# Patient Record
Sex: Male | Born: 1960 | Race: White | Hispanic: No | Marital: Married | Smoking: Never smoker
Health system: Southern US, Community
[De-identification: ages and names within clinical notes are randomized; demographics above are authoritative.]

## PROBLEM LIST (undated history)

## (undated) HISTORY — PX: BACK SURGERY: SHX140

## (undated) HISTORY — PX: VASECTOMY: SHX75

---

## 1998-02-07 ENCOUNTER — Ambulatory Visit (HOSPITAL_COMMUNITY): Admission: RE | Admit: 1998-02-07 | Discharge: 1998-02-07 | Payer: Self-pay | Admitting: Family Medicine

## 1998-03-15 ENCOUNTER — Encounter: Admission: RE | Admit: 1998-03-15 | Discharge: 1998-06-13 | Payer: Self-pay | Admitting: Family Medicine

## 2000-02-06 ENCOUNTER — Ambulatory Visit: Admission: RE | Admit: 2000-02-06 | Discharge: 2000-02-06 | Payer: Self-pay | Admitting: Internal Medicine

## 2000-02-06 ENCOUNTER — Encounter: Payer: Self-pay | Admitting: Internal Medicine

## 2005-06-27 ENCOUNTER — Emergency Department (HOSPITAL_COMMUNITY): Admission: EM | Admit: 2005-06-27 | Discharge: 2005-06-27 | Payer: Self-pay | Admitting: Emergency Medicine

## 2005-06-28 ENCOUNTER — Encounter: Admission: RE | Admit: 2005-06-28 | Discharge: 2005-06-28 | Payer: Self-pay | Admitting: Internal Medicine

## 2005-06-28 ENCOUNTER — Ambulatory Visit: Payer: Self-pay | Admitting: Internal Medicine

## 2007-04-09 ENCOUNTER — Telehealth: Payer: Self-pay | Admitting: Internal Medicine

## 2007-04-17 ENCOUNTER — Encounter: Payer: Self-pay | Admitting: *Deleted

## 2007-04-17 DIAGNOSIS — I219 Acute myocardial infarction, unspecified: Secondary | ICD-10-CM | POA: Insufficient documentation

## 2007-04-17 DIAGNOSIS — I201 Angina pectoris with documented spasm: Secondary | ICD-10-CM

## 2007-04-17 DIAGNOSIS — R55 Syncope and collapse: Secondary | ICD-10-CM

## 2007-04-17 DIAGNOSIS — Z8669 Personal history of other diseases of the nervous system and sense organs: Secondary | ICD-10-CM

## 2007-04-17 DIAGNOSIS — Z87442 Personal history of urinary calculi: Secondary | ICD-10-CM

## 2007-05-21 ENCOUNTER — Telehealth: Payer: Self-pay | Admitting: Internal Medicine

## 2007-07-02 ENCOUNTER — Telehealth: Payer: Self-pay | Admitting: Internal Medicine

## 2007-11-20 ENCOUNTER — Telehealth: Payer: Self-pay | Admitting: Internal Medicine

## 2007-12-01 ENCOUNTER — Encounter: Payer: Self-pay | Admitting: Internal Medicine

## 2008-04-02 ENCOUNTER — Ambulatory Visit: Payer: Self-pay | Admitting: Internal Medicine

## 2008-04-02 DIAGNOSIS — I83893 Varicose veins of bilateral lower extremities with other complications: Secondary | ICD-10-CM | POA: Insufficient documentation

## 2008-04-02 DIAGNOSIS — M25559 Pain in unspecified hip: Secondary | ICD-10-CM

## 2008-04-03 DIAGNOSIS — R234 Changes in skin texture: Secondary | ICD-10-CM | POA: Insufficient documentation

## 2008-05-07 ENCOUNTER — Ambulatory Visit: Payer: Self-pay | Admitting: Endocrinology

## 2008-06-25 ENCOUNTER — Telehealth: Payer: Self-pay | Admitting: Internal Medicine

## 2008-07-02 ENCOUNTER — Encounter: Payer: Self-pay | Admitting: Internal Medicine

## 2008-09-09 ENCOUNTER — Encounter: Payer: Self-pay | Admitting: Internal Medicine

## 2008-12-16 ENCOUNTER — Ambulatory Visit: Payer: Self-pay | Admitting: Internal Medicine

## 2009-01-10 ENCOUNTER — Telehealth: Payer: Self-pay | Admitting: Internal Medicine

## 2009-01-27 ENCOUNTER — Encounter: Payer: Self-pay | Admitting: Internal Medicine

## 2009-03-25 ENCOUNTER — Ambulatory Visit: Payer: Self-pay | Admitting: Internal Medicine

## 2009-08-26 ENCOUNTER — Telehealth: Payer: Self-pay | Admitting: Internal Medicine

## 2009-09-06 ENCOUNTER — Ambulatory Visit: Payer: Self-pay | Admitting: Internal Medicine

## 2009-09-07 DIAGNOSIS — M79609 Pain in unspecified limb: Secondary | ICD-10-CM | POA: Insufficient documentation

## 2009-09-09 ENCOUNTER — Telehealth: Payer: Self-pay | Admitting: Internal Medicine

## 2009-09-12 ENCOUNTER — Ambulatory Visit: Payer: Self-pay | Admitting: Family Medicine

## 2009-09-12 DIAGNOSIS — M7520 Bicipital tendinitis, unspecified shoulder: Secondary | ICD-10-CM | POA: Insufficient documentation

## 2009-10-19 ENCOUNTER — Telehealth: Payer: Self-pay | Admitting: Gastroenterology

## 2010-03-15 ENCOUNTER — Encounter: Admission: RE | Admit: 2010-03-15 | Discharge: 2010-03-15 | Payer: Self-pay | Admitting: Internal Medicine

## 2010-03-15 ENCOUNTER — Ambulatory Visit: Payer: Self-pay | Admitting: Internal Medicine

## 2010-03-15 ENCOUNTER — Telehealth: Payer: Self-pay | Admitting: Internal Medicine

## 2010-03-17 ENCOUNTER — Encounter: Payer: Self-pay | Admitting: Internal Medicine

## 2010-03-18 ENCOUNTER — Ambulatory Visit (HOSPITAL_COMMUNITY): Admission: RE | Admit: 2010-03-18 | Discharge: 2010-03-19 | Payer: Self-pay | Admitting: Neurosurgery

## 2010-04-10 ENCOUNTER — Encounter: Payer: Self-pay | Admitting: Internal Medicine

## 2010-06-13 ENCOUNTER — Encounter: Payer: Self-pay | Admitting: Internal Medicine

## 2010-07-18 NOTE — Progress Notes (Signed)
Summary: Change GI care  Phone Note Outgoing Call Call back at [facility] Phone [phone]   Call placed by: Harlow Mares CMA Duncan Dull),  Oct 19, 2009 11:29 AM Call placed to: Patient Summary of Call: due for colonoscopy...Marland KitchenMarland KitchenMarland Kitchen patient is going to [facility] now.  Initial call taken by: Harlow Mares CMA (AAMA),  Oct 19, 2009 11:30 AM

## 2010-07-18 NOTE — Assessment & Plan Note (Signed)
Summary: RIGHT ARM PAIN/NOT BETTER/ WANTED TO WAIT FOR MEN/NWS   Vital Signs:  Patient profile:   50 year old male Height:      69.5 inches (176.53 cm) Weight:      188 pounds (85.45 kg) O2 Sat:      95 % on Room air Temp:     98.5 degrees F (36.94 degrees C) oral Pulse rate:   81 / minute BP sitting:   142 / 60  (left arm) Cuff size:   regular  Vitals Entered By: Bill Salinas CMA (September 06, 2009 2:42 PM) Taken by [employer]  O2 Flow:  Room air CC: pt c/o constant pain in his R arm (radiating from mid-cubital to shoulder) X 5 months. per pt, last tetanus was 05/2009.//[place]   Primary Care Provider:  Jacques Navy MD  CC:  pt c/o constant pain in his R arm (radiating from mid-cubital to shoulder) X 5 months. per pt and last tetanus was 05/2009.//[place].  History of Present Illness: Mr. Cuny was seen by Dr. Yetta Barre Oct 8th for right arm pain diagnosed as bicipital tendonistis. An arm sleeve, iceing and rest were recommended. In the intervening weeks the discomfort has become worse especially with any forceful use of the arm or with full extension. He also reports noticeable difference in muscle mass at the distal aspect of the proximal musculature. Pain does radiate cephlad to the full biceps. He is minimally limited in his physical acitivity.   Current Medications (verified): 1)  Levitra 20 Mg Tabs (Vardenafil Hcl) .Marland Kitchen.. 1 Once Daily As Needed 2)  Multivitamins   Tabs (Multiple Vitamin) .... Take One Tablet Once Dailyd 3)  Epipen 0.3 Mg/0.26ml (1:1000)  Devi (Epinephrine Hcl (Anaphylaxis)) .... Use As Directed Pt Needs F/u in Office 4)  Ibuprofen 200 Mg Tabs (Ibuprofen) .... As Needed  Allergies (verified): 1)  ! Codeine 2)  ! Erythromycin PMH-FH-SH reviewed-no changes except otherwise noted  Review of Systems       The patient complains of muscle weakness.  The patient denies anorexia, weight loss, dyspnea on exertion, and enlarged lymph nodes.    Physical  Exam  General:  Well-developed,well-nourished,in no acute distress; alert,appropriate and cooperative throughout examination Msk:  no deformity of the right UE. With flexion and muscle flexing there appears to be a tissue deficit at the medial aspect of the biceps area at the origin. Good grip strength, normal sensation, normal corrdination.   Impression & Recommendations:  Problem # 1:  PAIN IN SOFT TISSUES OF LIMB (ICD-729.5) patients presentation and exam is suggestive of partial avulsion of the brachialis at the origin.  Plan - refer to Dr. Kerin Perna for sports medicine consult re: diagnosis and treatment. Appointment schedule for Monday, March 28th @ 11:00 AM - patient is aware.   Complete Medication List: 1)  Levitra 20 Mg Tabs (Vardenafil hcl) .Marland Kitchen.. 1 once daily as needed 2)  Multivitamins Tabs (Multiple vitamin) .... Take one tablet once dailyd 3)  Epipen 0.3 Mg/0.75ml (1:1000) Devi (Epinephrine hcl (anaphylaxis)) .... Use as directed pt needs f/u in office 4)  Ibuprofen 200 Mg Tabs (Ibuprofen) .... As needed  Other Orders: Sports Medicine (Sports Med)  Preventive Care Screening  Last Tetanus Booster:    Date:  05/18/2009    Results:  Given

## 2010-07-18 NOTE — Letter (Signed)
Summary: [facility]  [facility]   Imported By: Lester Napoleon 05/01/2010 10:20:08  _____________________________________________________________________  External Attachment:    Type:   Image     Comment:   External Document

## 2010-07-18 NOTE — Miscellaneous (Signed)
[facility]   Imported By: Lester Paris 09/15/2009 11:38:55  _____________________________________________________________________  External Attachment:    Type:   Image     Comment:   External Document

## 2010-07-18 NOTE — Progress Notes (Signed)
Summary: Levitra  Phone Note Refill Request Message from:  Patient on August 26, 2009 12:58 PM  Refills Requested: Medication #1:  LEVITRA 20 MG TABS 1 once daily as needed Initial call taken by: Lucious Groves,  August 26, 2009 12:58 PM    Prescriptions: LEVITRA 20 MG TABS (VARDENAFIL HCL) 1 once daily as needed  #6 x 12   Entered by:   Lucious Groves   Authorized by:   Jacques Navy MD   Signed by:   Lucious Groves on 08/26/2009   Method used:   [facility]  [address] [phone][facility] (retail[facility]       [address]       [place], [place]  [id number]       Ph: [phone] or [phone]       Fax: [phone]   RxID:   [id number]

## 2010-07-18 NOTE — Progress Notes (Signed)
Summary: OV TODAY?  Phone Note Call from Patient Call back at Home Phone [phone] Call back at Wife - Diane [phone]   Summary of Call: Pt was seen by ER over the weekend for back and leg pain. Patient is requesting office visit with MD for pain management while waiting on ER referral to neuro. OK for work in apt today?  Initial call taken by: Lamar Sprinkles, CMA,  March 15, 2010 10:13 AM  Follow-up for Phone Call        Scheduled for today, ok per MD Follow-up by: Lamar Sprinkles, CMA,  March 15, 2010 10:48 AM

## 2010-07-18 NOTE — Progress Notes (Signed)
Summary: FYI-Dr Dallas Schimke  Phone Note From Other Clinic   Summary of Call: Dr Dallas Schimke office called to notify that he was to see the patient on Monday, but MD will not be there, so the patient will be r/s to thurs. Initial call taken by: Lucious Groves,  September 09, 2009 10:21 AM     Appended Document: FYI-Dr Dallas Schimke I had a family emergency, but was able to be in the office.

## 2010-07-18 NOTE — Assessment & Plan Note (Signed)
Summary: ORTHO CONSULT PER DR NORINS/CLE   Vital Signs:  Patient profile:   50 year old male Height:      69.5 inches Weight:      190.8 pounds BMI:     27.87 Temp:     98.1 degrees F oral Pulse rate:   80 / minute Pulse rhythm:   regular BP sitting:   120 / 78  (left arm) Cuff size:   regular  Vitals Entered By: Benny Lennert CMA Duncan Dull) (September 12, 2009 10:48 AM)  Primary Care Provider:  Jacques Navy MD   History of Present Illness: Chief complaint consult from dr norins  50 year old with right arm pain seen at the request of Dr. Debby Bud:  In october, was in the gym was doing an arm work-out, then woke up and his arm really was hurting. however, he does not describe any focal occult injury at that time. at that time, he saw Dr. Yetta Barre, was  told he had some bicipital tendinitis, and  altered his  activities, discontinue weightlifting, and  took some ibuprofen and a compression sleeve.  Subsequently, he saw Dr. Debby Bud, and it was noted that he had some continued,  significant pain in his antecubital fossa  and pain with flexion.  at this point, he has continued to work out his left arm without any significant problems.  His altered his lips, and he is able to do some back work  with only some pain. He is able to do full weight pull-ups, and other back work.  Took about 2 months off. No change now. But cannot do as much with this arm. Always will hurt.   No PT, really active, wants to work out with his 86 year old son hard.    Allergies: 1)  ! Codeine 2)  ! Erythromycin  Past History:  Past medical, surgical, family and social histories (including risk factors) reviewed, and no changes noted (except as noted below).  Past Medical History: Reviewed history from 04/17/2007 and no changes required. 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 father - deceased @ 45: Lung cancer - smoker, DM mother - 42:  good health, "borderline" DM Neg- prostate or colon cancer Brother - DM  Social History: Reviewed history from 04/02/2008 and no changes required. 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  Review of Systems       REVIEW OF SYSTEMS  GEN: No systemic complaints, no fevers, chills, sweats, or other acute illnesses MSK: Detailed in the HPI GI: tolerating PO intake without difficulty Neuro: No numbness, parasthesias, or tingling associated. Otherwise the pertinent positives of the ROS are noted above.    Physical Exam  General:  GEN: Well-developed,well-nourished,in no acute distress; alert,appropriate and cooperative throughout examination HEENT: Normocephalic and atraumatic without obvious abnormalities. No apparent alopecia or balding. Ears, externally no deformities PULM: Breathing comfortably in no respiratory distress EXT: No clubbing, cyanosis, or edema PSYCH: Normally interactive. Cooperative during the interview. Pleasant. Friendly and conversant. Not anxious or depressed appearing. Normal, full affect.  Additional Exam:  Shoulder: Right Inspection: No muscle wasting or winging Ecchymosis/edema:  neg  AC joint, scapula, clavicle: NT Cervical spine: NT, full ROM Spurling's: neg Abduction: full, 5/5 Flexion: full, 5/5 IR, full, lift-off: 5/5 ER at neutral: full, 5/5 AC crossover and compression: neg Neer: neg Hawkins: neg Drop Test: neg Empty Can: neg Supraspinatus insertion: NT Bicipital groove: NT Speed's: POS Yergason's: POS Sulcus sign: neg C5-T1 intact Sensation intact Grip 5/5    RIGHT elbow Ecchymosis or edema: neg ROM: full flexion, extension, pronation, supination Flexion: 4/5,  tested for strength, speech test is markedly positive in Yergason's test is markedly positive  in the distal bicep. There is no discrete muscular defect seen at time of  strength testing. There is some difference in biceps volume compared to the contralateral side  Lateral brachialis is notably tender to palpation  throughout, more so than the distal biceps tendon  Extension: 5/5 Supination: 4/5 , resisted supination causes marked pain Pronation: 5/5 Wrist ext: 5/5 Wrist flexion: 5/5 No gross bony abnormality Varus and Valgus stress: stable ECRB tenderness: neg Medial epicondyle: NT Lateral epicondyle, resisted wrist extension from wrist full pronation and flexion: NT grip: 5/5  sensation intact Tinel's, Elbow: negative    Impression & Recommendations:  Problem # 1:  PAIN IN SOFT TISSUES OF LIMB (ICD-729.5) Assessment New most properly,  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  At initial time of injury, cannot rule out partial  avulsion,  however at this time there is no muscular defect  that I can appreciate, and at this point, scar tissue would formed and treatment algorithms are the same.  of utmost importance, this  needs to be  properly rehabbed. I reviewed with him Dr. Caralee Ates elbow program,, with key emphasis on  eccentric  pronation and supination. And also asked him to do  isolation  bicep  eccentric exercise with  traditional palm up  eccentric lowers, eccentric hammer curls, and eccentric  reverse curls. think this will most likely get all aspects of the biceps and brachialis.  acentric exercises been shown in multiple  studies in tendinopathy, mostly in Achilles tendon not become a patellar tendinopathy, rotator cuff tendinopathy, and medial epicondylitis and lateral epicondylitis to aid in tendon remodelling, encourage blood flow on doppler, and reverse  avascular tendon degeneration.   in almost all cases, these  injuries do well with nonoperative conservative management.  Thank for the loss and muscle volume is most likely due to deconditioning compared to the contralateral side.  Voltaren gel 4 times daily  Cross friction massage  cc: Dr. Illene Regulus  Problem # 2:  BICEPS TENDINITIS, RIGHT (SEG-315.17) Assessment: New  Complete Medication List: 1)  Levitra 20 Mg Tabs (Vardenafil hcl) .Marland Kitchen.. 1 once daily as needed 2)  Multivitamins Tabs (Multiple vitamin) .... Take one tablet once dailyd 3)  Epipen 0.3 Mg/0.69ml (1:1000) Devi (Epinephrine hcl (anaphylaxis)) .... Use as directed pt needs f/u in office 4)  Voltaren 1 % Gel (Diclofenac sodium) .... Apply 4 times daily to affected area  Patient Instructions: 1)  f/u 6 weeks Prescriptions: VOLTAREN 1 %  GEL (DICLOFENAC SODIUM) Apply 4 times daily to affected area  #3 tubes x 11   Entered and Authorized by:   Hannah Beat MD   Signed by:   Hannah Beat MD on 09/12/2009   Method used:   [facility]  [address] [phone]* (retail)       [address]       [place]  [place], [place]  [id number]       Ph: [phone] or [phone]       Fax: [phone]   RxID:   [id number]   Current Allergies (reviewed today): ! CODEINE ! ERYTHROMYCIN

## 2010-07-18 NOTE — Consult Note (Signed)
Summary: [facility]   Imported By: Sherian Rein 03/28/2010 15:01:18  _____________________________________________________________________  External Attachment:    Type:   Image     Comment:   External Document

## 2010-07-18 NOTE — Assessment & Plan Note (Signed)
Summary: req pain meds/SD   Vital Signs:  Patient profile:   50 year old male Height:      69.5 inches Weight:      187 pounds BMI:     27.32 O2 Sat:      98 % on Room air Temp:     97.9 degrees F oral Pulse rate:   82 / minute BP sitting:   160 / 100  (left arm) Cuff size:   regular  Vitals Entered By: Bill Salinas CMA (March 15, 2010 11:33 AM)  O2 Flow:  Room air CC: ov to discuss pain management until he can be seen with a nuerologist/ ab   Primary Care Provider:  Jacques Navy MD  CC:  ov to discuss pain management until he can be seen with a nuerologist/ ab.  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 He was seen at an Urgent care, diagnosed with back pain/strain and was put on flexeril. He took this for a week. He was careful about lifting and using his back and the pain was reasonable. Saturday he had th on-set of severe pain in the left low back with radiation all the way to his foot. He was seen at [facility] ED where he was told that plain x-rays were unrevealing. He was given Prednisone 50,40,30,30, 10 to take as well as oxycodone which did allow him to sleep. No loss of control of bowel or bladder.  He present today with excruciating pain unrelieved by prednisone. He has a hard time walking and the legs feels like it is tingling and uncomfortable.   Current Medications (verified): 1)  Levitra 20 Mg Tabs (Vardenafil Hcl) .Marland Kitchen.. 1 Once Daily As Needed 2)  Multivitamins   Tabs (Multiple Vitamin) .... Take One Tablet Once Dailyd 3)  Epipen 0.3 Mg/0.25ml (1:1000)  Devi (Epinephrine Hcl (Anaphylaxis)) .... Use As Directed Pt Needs F/u in Office 4)  Voltaren 1 %  Gel (Diclofenac Sodium) .... Apply 4 Times Daily To Affected Area 5)  Oxycodone-Acetaminophen 5-325 Mg Tabs (Oxycodone-Acetaminophen) .Marland Kitchen.. 1 To 2 Tablets Every 4 Hours As Needed 6)  Prednisone 10 Mg Tabs (Prednisone) 7)  Advil 200 Mg  Tabs (Ibuprofen) .Marland Kitchen.. 1 Tab As Needed  Allergies (verified): 1)  ! Codeine 2)  ! 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  Risk Factors: Exercise: yes (April 24, 2008)  Risk Factors: Smoking Status: never (03/25/2009)  Review of Systems  The patient denies anorexia, fever, weight loss, weight gain, chest pain, dyspnea on exertion, peripheral edema, abdominal pain, severe indigestion/heartburn, muscle weakness, difficulty walking, depression, abnormal bleeding, and angioedema.    Physical Exam  General:  WNWD white male in acute distress. Head:  normocephalic and atraumatic.   Eyes:  vision grossly intact, pupils equal, and pupils round.  Ears:  R ear normal and L ear normal.   Neck:  supple and full ROM.   Lungs:  normal respiratory effort and normal breath sounds.   Heart:  normal rate and regular rhythm.   Msk:  back exam - unable to stand without assistance, able to flex to 10 degress before being limited by pain. Unable to stand on his toes with his left leg giving away. Required assist to step up to exam table with left leg. Positive SLR sitting with both legs with more pain raising the left leg. Decfreased patellar reflex on the left. Normal sensation to light touch.    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  Plan - oxycodone/APAP 5/325           MRI L-S spine [facility] [facility] [place] 4:30PM today           Urgent referral to [facility].  Workman's comp information: [employer] Claim # [id number]     send all reports to [facility] [address] [place]  [address]  His updated medication list for this problem includes:    Oxycodone-acetaminophen 5-325 Mg Tabs (Oxycodone-acetaminophen) .Marland Kitchen... 1 to 2 tablets every 4 hours as needed    Advil 200 Mg Tabs (Ibuprofen) .Marland Kitchen... 1 tab as needed  Orders: Neurosurgeon Referral Psychologist, educational) Radiology Referral (Radiology)  Complete Medication List: 1)  Levitra 20 Mg Tabs (Vardenafil hcl) .Marland Kitchen.. 1 once daily as needed 2)  Multivitamins Tabs (Multiple vitamin) .... Take one tablet once dailyd 3)  Epipen 0.3 Mg/0.5ml (1:1000) Devi (Epinephrine hcl (anaphylaxis)) .... Use as directed pt needs f/u in office 4)  Voltaren 1 % Gel (Diclofenac sodium) .... Apply 4 times daily to affected area 5)  Oxycodone-acetaminophen 5-325 Mg Tabs (Oxycodone-acetaminophen) .Marland Kitchen.. 1 to 2 tablets every 4 hours as needed 6)  Prednisone 10 Mg Tabs (Prednisone) 7)  Advil 200 Mg Tabs (Ibuprofen) .Marland Kitchen.. 1 tab as needed  Patient Instructions: 1)  Acute back pain - suspect a herniated disk with nerve compression. Plan 0 oxycodone/APAP 5/325 every 6 hours as needed. MRI lumbar spine TODAY 4:30 PM at [facility]. Urgent referral is being made to [facility]. Prescriptions: OXYCODONE-ACETAMINOPHEN 5-325 MG TABS (OXYCODONE-ACETAMINOPHEN) 1 to 2 tablets every 4 hours as needed  #30 x 0   Entered and Authorized by:   Jacques Navy MD   Signed by:   Jacques Navy MD on 03/15/2010   Method used:   Print then Give to Patient   RxID:    [id number]

## 2010-07-20 NOTE — Letter (Signed)
Summary: [facility]  [facility]   Imported By: Sherian Rein 06/29/2010 11:20:50  _____________________________________________________________________  External Attachment:    Type:   Image     Comment:   External Document

## 2010-08-31 LAB — CBC
HCT: 42 % (ref 39.0–52.0)
Hemoglobin: 14.9 g/dL (ref 13.0–17.0)
MCH: 32.4 pg (ref 26.0–34.0)
MCHC: 35.5 g/dL (ref 30.0–36.0)
MCV: 91.3 fL (ref 78.0–100.0)
Platelets: 184 10*3/uL (ref 150–400)
RBC: 4.6 MIL/uL (ref 4.22–5.81)
RDW: 13 % (ref 11.5–15.5)
WBC: 7.7 10*3/uL (ref 4.0–10.5)

## 2010-08-31 LAB — MRSA PCR SCREENING: MRSA by PCR: NEGATIVE

## 2011-01-07 ENCOUNTER — Other Ambulatory Visit: Payer: Self-pay | Admitting: Internal Medicine

## 2012-05-30 ENCOUNTER — Ambulatory Visit: Payer: Self-pay | Admitting: Internal Medicine

## 2012-05-30 ENCOUNTER — Telehealth: Payer: Self-pay | Admitting: Internal Medicine

## 2012-05-30 DIAGNOSIS — Z0289 Encounter for other administrative examinations: Secondary | ICD-10-CM

## 2012-05-30 NOTE — Telephone Encounter (Signed)
Patient Information:  Caller Name: Rehan  Phone: [phone]  Patient: Troy Barr, Troy Barr  Gender: Male  DOB: February 27, 1961  Age: 51 Years  PCP: Illene Regulus (Adults only)  Office Follow Up:  Does the office need to follow up with this patient?: No  Instructions For The Office: N/A  RN Note:  It starts at rest and may be anywhere home,church or school.  Mostly occurs at rest, but can happen in gym.  More toward the left side; deep-4/10 on pain scale lasting 2-3 minutes to hour. Describes more pressure than pain. Just below rib cage on left side and in to left shoulder.  Has skip beats occasionally, but is chronic.  Last episode of pain was 05/29/12. Dr. Debby Bud is gone for the day but did agree to be seen by another provider. Call came in as emergent.  Verbally assessed clinical profile stating allergic to amoxicillin, has been taking a baby ASA-no other medication and denies current health problems.  Appointment made with Dr. Yetta Barre at 15:45  Symptoms  Reason For Call & Symptoms: Chest pain daily; Is SOB on exertion. Both for past 2 weeks.  Is fairly fit and works  Reviewed Health History In EMR: N/A  Reviewed Medications In EMR: N/A  Reviewed Allergies In EMR: N/A  Reviewed Surgeries / Procedures: N/A  Date of Onset of Symptoms: 05/16/2012  Guideline(s) Used:  Chest Pain  Disposition Per Guideline:   See Today in Office  Reason For Disposition Reached:   Patient wants to be seen  Advice Given:  N/A  Appointment Scheduled:  05/30/2012 15:45:00 Appointment Scheduled Provider:  Sanda Linger (Adults only)

## 2012-06-02 NOTE — Telephone Encounter (Signed)
I do not find any office note for evaluation of his chest pain. He can be added on to today's schedule for his chest pain.

## 2012-06-09 ENCOUNTER — Other Ambulatory Visit (HOSPITAL_BASED_OUTPATIENT_CLINIC_OR_DEPARTMENT_OTHER): Payer: Self-pay | Admitting: Family Medicine

## 2012-06-09 ENCOUNTER — Ambulatory Visit (HOSPITAL_BASED_OUTPATIENT_CLINIC_OR_DEPARTMENT_OTHER)
Admission: RE | Admit: 2012-06-09 | Discharge: 2012-06-09 | Disposition: A | Discharge status: Home / Self Care | Payer: BC Managed Care – PPO | Source: Ambulatory Visit | Attending: Family Medicine | Admitting: Family Medicine

## 2012-06-09 DIAGNOSIS — N200 Calculus of kidney: Secondary | ICD-10-CM

## 2012-06-09 DIAGNOSIS — R109 Unspecified abdominal pain: Secondary | ICD-10-CM | POA: Insufficient documentation

## 2012-06-25 ENCOUNTER — Encounter: Payer: Self-pay | Admitting: Internal Medicine

## 2012-11-24 ENCOUNTER — Telehealth: Payer: Self-pay | Admitting: *Deleted

## 2012-11-24 MED ORDER — MEFLOQUINE HCL 250 MG PO TABS: 250.0000 mg | ORAL_TABLET | ORAL | Status: AC

## 2012-11-24 NOTE — Telephone Encounter (Signed)
Notified pt with md response. Verify when he will be leaving July 24th & return on august 4th. [facility]form [facility] [address] [facility].../lmb

## 2012-11-24 NOTE — Telephone Encounter (Signed)
O)K for mefloquine 250 mg once a week: start 2 weeks before travel, weekly while in [place] and for 4 weeks after return. Thanks

## 2012-11-24 NOTE — Telephone Encounter (Signed)
Left msg on triage stating leaving in July for a mission trip going to [place]. Have all his immunizations already, but needing md to rx anti-maleria pills. Need to start taking couple days before leaving wanting get rx call into pharmacy...Raechel Chute

## 2013-08-12 ENCOUNTER — Telehealth: Payer: Self-pay | Admitting: Internal Medicine

## 2013-08-12 NOTE — Telephone Encounter (Signed)
[facility], sent to Dr. Debby BudNorins on 08/12/13/ss.

## 2017-04-09 ENCOUNTER — Emergency Department (HOSPITAL_COMMUNITY)
Admission: EM | Admit: 2017-04-09 | Discharge: 2017-04-09 | Disposition: A | Discharge status: Home / Self Care | Payer: BLUE CROSS/BLUE SHIELD | Attending: Emergency Medicine | Admitting: Emergency Medicine

## 2017-04-09 ENCOUNTER — Encounter (HOSPITAL_COMMUNITY): Payer: Self-pay | Admitting: Emergency Medicine

## 2017-04-09 DIAGNOSIS — Y929 Unspecified place or not applicable: Secondary | ICD-10-CM | POA: Diagnosis not present

## 2017-04-09 DIAGNOSIS — Y998 Other external cause status: Secondary | ICD-10-CM | POA: Diagnosis not present

## 2017-04-09 DIAGNOSIS — I252 Old myocardial infarction: Secondary | ICD-10-CM | POA: Diagnosis not present

## 2017-04-09 DIAGNOSIS — S1096XA Insect bite of unspecified part of neck, initial encounter: Secondary | ICD-10-CM | POA: Diagnosis not present

## 2017-04-09 DIAGNOSIS — T7840XA Allergy, unspecified, initial encounter: Secondary | ICD-10-CM | POA: Insufficient documentation

## 2017-04-09 DIAGNOSIS — Y9389 Activity, other specified: Secondary | ICD-10-CM | POA: Insufficient documentation

## 2017-04-09 DIAGNOSIS — L509 Urticaria, unspecified: Secondary | ICD-10-CM | POA: Diagnosis present

## 2017-04-09 DIAGNOSIS — W57XXXA Bitten or stung by nonvenomous insect and other nonvenomous arthropods, initial encounter: Secondary | ICD-10-CM | POA: Diagnosis not present

## 2017-04-09 MED ORDER — SODIUM CHLORIDE 0.9 % IV SOLN
Freq: Once | INTRAVENOUS | Status: AC
  Administered 2017-04-09: 500 mL via INTRAVENOUS

## 2017-04-09 MED ORDER — PREDNISONE 20 MG PO TABS
60.0000 mg | ORAL_TABLET | Freq: Once | ORAL | Status: AC
  Administered 2017-04-09: 60 mg via ORAL
  Filled 2017-04-09: qty 3

## 2017-04-09 MED ORDER — SODIUM CHLORIDE 0.9 % IV BOLUS (SEPSIS): 1000.0000 mL | Freq: Once | INTRAVENOUS | Status: DC

## 2017-04-09 MED ORDER — PREDNISONE 20 MG PO TABS: 40.0000 mg | ORAL_TABLET | Freq: Every day | ORAL | 0 refills | Status: DC

## 2017-04-09 MED ORDER — EPINEPHRINE 0.15 MG/0.15ML IJ SOAJ: 0.1500 mg | INTRAMUSCULAR | 0 refills | Status: AC | PRN

## 2017-04-09 NOTE — Discharge Instructions (Signed)
Continue benadryl 25mg  every 4 hrs. Take prednisone as prescribed until all gone. Follow up with family doctor. Return if any worsening symptoms. If any swelling to the lips, tongue, shortness of breath, use epi pen and call 911

## 2017-04-09 NOTE — ED Triage Notes (Signed)
Pt stated that he was stung on the back of his neck by a bee. 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 Self administered an Epi pen then called EMS. Currently c/o shaking and nausea.

## 2017-04-09 NOTE — ED Triage Notes (Signed)
Per EMS- pt was stung on the back of neck by a bee. Pt self administered an out of date Epi pen at 1245  to r/lateral thigh. IV initiated in l/anticbital. Given Benadryl 50mg  IV and Zofran 4mg  IV. Pt tolerated well. Continues to c/o shakiness and nausea. Denies shortness of breath

## 2017-04-09 NOTE — ED Triage Notes (Signed)
Wife at bedside.

## 2017-04-09 NOTE — ED Provider Notes (Signed)
[facility]-EMERGENCY DEPT Provider Note   CSN: [id number] Arrival date & time: 04/09/17  1336     History   Chief Complaint Chief Complaint  Patient presents with  . Allergic Reaction    HPI Troy Barr is a 56 y.o. male.  HPI Troy Barr is a 56 y.o. male presents to emergency department with allergic reaction after being stung by a bleed. Patient states he gets stung on the neck about 4.5 hours ago. He used his EpiPen right away. He reports associated hives to the back and chest. He reports some shortness of breath, nausea, dizziness. He called EMS. EMS gave him Zofran for his nausea and Benadryl, 50 mg IV. Patient presented to ED with shakiness and nausea. No other associated symptoms. He was observed in triage prior to me seeing him, since there were no beds available and the back. Patient states he is currently completely asymptomatic. Denies any rash or itching. No swelling to the tongue, throat, ears, lips. Denies any chest pain or shortness of breath.  History reviewed. No pertinent past medical history.  Patient Active Problem List   Diagnosis Date Noted  . BICEPS TENDINITIS, RIGHT 09/12/2009  . PAIN IN SOFT TISSUES OF LIMB 09/07/2009  . CHANGES IN SKIN TEXTURE 04/03/2008  . VARICOSE VEINS LOWER EXTREMITIES W/OTH COMPS 04/02/2008  . HIP PAIN, RIGHT 04/02/2008  . MYOCARDIAL INFARCTION 04/17/2007  . ANGINA, PRINZMETAL 04/17/2007  . VASOVAGAL SYNCOPE 04/17/2007  . VERTIGO, HX OF 04/17/2007  . NEPHROLITHIASIS, HX OF 04/17/2007    Past Surgical History:  Procedure Laterality Date  . BACK SURGERY    . VASECTOMY         Home Medications    Prior to Admission medications   Medication Sig Start Date End Date Taking? Authorizing Provider  mefloquine (LARIAM) 250 MG tablet Take 1 tablet (250 mg total) by mouth every 7 (seven) days. Start 2 weeks before, then weekly in [place], and 4 weeks after return 11/24/12   Norins, Rosalyn GessMichael E,  MD    Family History Family History  Problem Relation Age of Onset  . Diabetes Mother   . Cancer Father     Social History Social History  Substance Use Topics  . Smoking status: Never Smoker  . Smokeless tobacco: Never Used  . Alcohol use No     Allergies   Bee venom; Codeine; and Erythromycin   Review of Systems Review of Systems  Constitutional: Negative for chills and fever.  Respiratory: Positive for shortness of breath. Negative for cough and chest tightness.   Cardiovascular: Negative for chest pain, palpitations and leg swelling.  Gastrointestinal: Positive for nausea. Negative for abdominal distention, abdominal pain, diarrhea and vomiting.  Genitourinary: Negative for dysuria, frequency, hematuria and urgency.  Musculoskeletal: Negative for arthralgias, myalgias, neck pain and neck stiffness.  Skin: Positive for rash.  Allergic/Immunologic: Negative for immunocompromised state.  Neurological: Positive for dizziness and light-headedness. Negative for weakness, numbness and headaches.  All other systems reviewed and are negative.    Physical Exam Updated Vital Signs BP (!) 157/97 (BP Location: Right Arm)   Pulse 76   Temp 98.7 F (37.1 C) (Oral)   Resp 18   SpO2 95%   Physical Exam  Constitutional: He is oriented to person, place, and time. He appears well-developed and well-nourished. No distress.  HENT:  Head: Normocephalic and atraumatic.  Right Ear: External ear normal.  Left Ear: External ear normal.  No swelling of lips, tongue, oropharynx.  Eyes: Conjunctivae are normal.  Neck: Normal range of motion. Neck supple.  Cardiovascular: Normal rate, regular rhythm and normal heart sounds.   Pulmonary/Chest: Effort normal and breath sounds normal. No respiratory distress. He has no wheezes. He has no rales.  Abdominal: Soft. Bowel sounds are normal. He exhibits no distension. There is no tenderness. There is no rebound.  Musculoskeletal: He exhibits  no edema.  Neurological: He is alert and oriented to person, place, and time.  Skin: Skin is warm and dry.  Nursing note and vitals reviewed.    ED Treatments / Results  Labs (all labs ordered are listed, but only abnormal results are displayed) Labs Reviewed - No data to display  EKG  EKG Interpretation None       Radiology No results found.  Procedures Procedures (including critical care time)  Medications Ordered in ED Medications  0.9 %  sodium chloride infusion (500 mLs Intravenous New Bag/Given 04/09/17 1417)     Initial Impression / Assessment and Plan / ED Course  I have reviewed the triage vital signs and the nursing notes.  Pertinent labs & imaging results that were available during my care of the patient were reviewed by me and considered in my medical decision making (see chart for details).     Pt in ED after hives, nausea, dizziness after a bee sting. Used epi pen. Got 50mg  of benadryl iv and 4mg  zofran iV. He has been in dept for almost 4 hrs, states currently symptom free. Ready to go home. Will start on short course of prednisone, benadryl, will refill epi pen. Return precautions discussed.  Home in NAD.   Vitals:   04/09/17 1344 04/09/17 1355 04/09/17 1440  BP:  (!) 162/99 (!) 157/97  Pulse:  82 76  Resp:  18 18  Temp:  98.7 F (37.1 C)   TempSrc:  Oral   SpO2: 95% 97% 95%     Final Clinical Impressions(s) / ED Diagnoses   Final diagnoses:  Allergic reaction, initial encounter    New Prescriptions New Prescriptions   EPINEPHRINE 0.15 MG/0.15ML IJ INJECTION    Inject 0.15 mLs (0.15 mg total) into the muscle as needed for anaphylaxis.   PREDNISONE (DELTASONE) 20 MG TABLET    Take 2 tablets (40 mg total) by mouth daily.     Jaynie Crumble, PA-C 04/09/17 1744    Alvira Monday, MD 04/10/17 1327

## 2017-07-29 ENCOUNTER — Emergency Department (HOSPITAL_BASED_OUTPATIENT_CLINIC_OR_DEPARTMENT_OTHER): Payer: BLUE CROSS/BLUE SHIELD

## 2017-07-29 ENCOUNTER — Emergency Department (HOSPITAL_BASED_OUTPATIENT_CLINIC_OR_DEPARTMENT_OTHER)
Admission: EM | Admit: 2017-07-29 | Discharge: 2017-07-29 | Disposition: A | Discharge status: Home / Self Care | Payer: BLUE CROSS/BLUE SHIELD | Attending: Emergency Medicine | Admitting: Emergency Medicine

## 2017-07-29 ENCOUNTER — Encounter (HOSPITAL_BASED_OUTPATIENT_CLINIC_OR_DEPARTMENT_OTHER): Payer: Self-pay | Admitting: *Deleted

## 2017-07-29 ENCOUNTER — Other Ambulatory Visit: Payer: Self-pay

## 2017-07-29 DIAGNOSIS — Z79899 Other long term (current) drug therapy: Secondary | ICD-10-CM | POA: Diagnosis not present

## 2017-07-29 DIAGNOSIS — R1011 Right upper quadrant pain: Secondary | ICD-10-CM | POA: Insufficient documentation

## 2017-07-29 DIAGNOSIS — R197 Diarrhea, unspecified: Secondary | ICD-10-CM | POA: Insufficient documentation

## 2017-07-29 DIAGNOSIS — R112 Nausea with vomiting, unspecified: Secondary | ICD-10-CM | POA: Diagnosis not present

## 2017-07-29 LAB — URINALYSIS, ROUTINE W REFLEX MICROSCOPIC
Bilirubin Urine: NEGATIVE
GLUCOSE, UA: NEGATIVE mg/dL
HGB URINE DIPSTICK: NEGATIVE
Ketones, ur: NEGATIVE mg/dL
Leukocytes, UA: NEGATIVE
Nitrite: NEGATIVE
Protein, ur: NEGATIVE mg/dL
SPECIFIC GRAVITY, URINE: 1.02 (ref 1.005–1.030)
pH: 6 (ref 5.0–8.0)

## 2017-07-29 LAB — LIPASE, BLOOD: LIPASE: 29 U/L (ref 11–51)

## 2017-07-29 LAB — COMPREHENSIVE METABOLIC PANEL
ALT: 68 U/L — AB (ref 17–63)
AST: 67 U/L — AB (ref 15–41)
Albumin: 4.4 g/dL (ref 3.5–5.0)
Alkaline Phosphatase: 74 U/L (ref 38–126)
Anion gap: 10 (ref 5–15)
BILIRUBIN TOTAL: 0.9 mg/dL (ref 0.3–1.2)
BUN: 14 mg/dL (ref 6–20)
CHLORIDE: 105 mmol/L (ref 101–111)
CO2: 23 mmol/L (ref 22–32)
Calcium: 9.2 mg/dL (ref 8.9–10.3)
Creatinine, Ser: 0.91 mg/dL (ref 0.61–1.24)
GFR calc Af Amer: >60 mL/min (ref >60)
Glucose, Bld: 95 mg/dL (ref 65–99)
Potassium: 4.2 mmol/L (ref 3.5–5.1)
Sodium: 138 mmol/L (ref 135–145)
Total Protein: 7.5 g/dL (ref 6.5–8.1)

## 2017-07-29 LAB — CBC
HCT: 41.8 % (ref 39.0–52.0)
Hemoglobin: 14.4 g/dL (ref 13.0–17.0)
MCH: 31.9 pg (ref 26.0–34.0)
MCHC: 34.4 g/dL (ref 30.0–36.0)
MCV: 92.7 fL (ref 78.0–100.0)
PLATELETS: 120 10*3/uL — AB (ref 150–400)
RBC: 4.51 MIL/uL (ref 4.22–5.81)
RDW: 13.1 % (ref 11.5–15.5)
WBC: 6.4 10*3/uL (ref 4.0–10.5)

## 2017-07-29 MED ORDER — ONDANSETRON HCL 4 MG/2ML IJ SOLN
4.0000 mg | Freq: Once | INTRAMUSCULAR | Status: AC | PRN
  Administered 2017-07-29: 4 mg via INTRAVENOUS
  Filled 2017-07-29: qty 2

## 2017-07-29 MED ORDER — OXYCODONE HCL 10 MG PO TABS: 10.0000 mg | ORAL_TABLET | ORAL | 0 refills | Status: AC | PRN

## 2017-07-29 MED ORDER — HYDROMORPHONE HCL 1 MG/ML IJ SOLN
1.0000 mg | Freq: Once | INTRAMUSCULAR | Status: AC
  Administered 2017-07-29: 1 mg via INTRAVENOUS
  Filled 2017-07-29: qty 1

## 2017-07-29 MED ORDER — OXYCODONE-ACETAMINOPHEN 5-325 MG PO TABS: 2.0000 | ORAL_TABLET | ORAL | 0 refills | Status: DC | PRN

## 2017-07-29 MED ORDER — ONDANSETRON 4 MG PO TBDP: ORAL_TABLET | ORAL | 0 refills | Status: AC

## 2017-07-29 MED ORDER — IOPAMIDOL (ISOVUE-300) INJECTION 61%
100.0000 mL | Freq: Once | INTRAVENOUS | Status: AC | PRN
  Administered 2017-07-29: 100 mL via INTRAVENOUS

## 2017-07-29 MED ORDER — SODIUM CHLORIDE 0.9 % IV BOLUS (SEPSIS)
1000.0000 mL | Freq: Once | INTRAVENOUS | Status: AC
  Administered 2017-07-29: 1000 mL via INTRAVENOUS

## 2017-07-29 MED ORDER — MORPHINE SULFATE (PF) 4 MG/ML IV SOLN
4.0000 mg | Freq: Once | INTRAVENOUS | Status: AC
  Administered 2017-07-29: 4 mg via INTRAVENOUS
  Filled 2017-07-29: qty 1

## 2017-07-29 NOTE — ED Triage Notes (Signed)
Pt c/o right lower abd pain/ nausea  x 4 days, sent here from PMD for eval

## 2017-07-29 NOTE — ED Provider Notes (Signed)
[facility] EMERGENCY DEPARTMENT Provider Note   CSN: [id number] Arrival date & time: 07/29/17  1428     History   Chief Complaint Chief Complaint  Patient presents with  . Abdominal Pain    HPI  Troy Barr is a 57 y.o. Male who is otherwise healthy, presents to the ED from his PCPs office for evaluation of right upper quadrant pain.  Reports right-sided abdominal pain has been present for the past 4 days, has been constant in nature and progressively worsening.  Denies any aggravating or alleviating factors, Tylenol and ibuprofen have not provided relief for pain.  Patient reports associated nausea for the past 2 days, one episode of nonbloody nonbilious vomiting.  Patient also reports several episodes of diarrhea, nonbloody.  Patient reports some subjective fevers and chills, afebrile here in the ED. patient denies any dysuria.  Reports he has had kidney stones in the past, reports this pain does not feel anything like that.  Denies any chest pain, shortness of breath, cough or recent upper respiratory symptoms.       History reviewed. No pertinent past medical history.  Patient Active Problem List   Diagnosis Date Noted  . BICEPS TENDINITIS, RIGHT 09/12/2009  . PAIN IN SOFT TISSUES OF LIMB 09/07/2009  . CHANGES IN SKIN TEXTURE 04/03/2008  . VARICOSE VEINS LOWER EXTREMITIES W/OTH COMPS 04/02/2008  . HIP PAIN, RIGHT 04/02/2008  . MYOCARDIAL INFARCTION 04/17/2007  . ANGINA, PRINZMETAL 04/17/2007  . VASOVAGAL SYNCOPE 04/17/2007  . VERTIGO, HX OF 04/17/2007  . NEPHROLITHIASIS, HX OF 04/17/2007    Past Surgical History:  Procedure Laterality Date  . BACK SURGERY    . VASECTOMY         Home Medications    Prior to Admission medications   Medication Sig Start Date End Date Taking? Authorizing Provider  acetaminophen (TYLENOL) 325 MG tablet Take 650 mg by mouth every 6 (six) hours as needed.   Yes [provider]  EPINEPHrine 0.15  MG/0.15ML IJ injection Inject 0.15 mLs (0.15 mg total) into the muscle as needed for anaphylaxis. 04/09/17   Kirichenko, Tatyana, PA-C  hydrOXYzine (VISTARIL) 25 MG capsule Take 25 mg by mouth every 7 (seven) days. 04/01/17   [provider]  mefloquine (LARIAM) 250 MG tablet Take 1 tablet (250 mg total) by mouth every 7 (seven) days. Start 2 weeks before, then weekly in [place], and 4 weeks after return Patient not taking: Reported on 04/09/2017 11/24/12   Norins, Rosalyn GessMichael E, MD    Family History Family History  Problem Relation Age of Onset  . Diabetes Mother   . Cancer Father     Social History Social History   Tobacco Use  . Smoking status: Never Smoker  . Smokeless tobacco: Never Used  Substance Use Topics  . Alcohol use: No  . Drug use: No     Allergies   Bee venom; Codeine; and Erythromycin   Review of Systems Review of Systems  Constitutional: Positive for chills and fever.  HENT: Negative for congestion, rhinorrhea and sore throat.   Eyes: Negative for visual disturbance.  Respiratory: Negative for cough, chest tightness, shortness of breath and wheezing.   Cardiovascular: Negative for chest pain and leg swelling.  Gastrointestinal: Positive for abdominal pain, diarrhea, nausea and vomiting. Negative for blood in stool.  Genitourinary: Negative for dysuria, flank pain and frequency.  Musculoskeletal: Negative for back pain and myalgias.  Skin: Negative for pallor and rash.  Neurological: Negative for dizziness, light-headedness and  headaches.     Physical Exam Updated Vital Signs BP (!) 177/78   Pulse 84   Temp 98.8 F (37.1 C)   Resp 16   Ht 5\' 11"  (1.803 m)   Wt 104.3 kg (230 lb)   SpO2 97%   BMI 32.08 kg/m   Physical Exam  Constitutional: He appears well-developed and well-nourished. No distress.  HENT:  Head: Normocephalic and atraumatic.  Mouth/Throat: Oropharynx is clear and moist.  Eyes: Right eye exhibits no discharge. Left eye  exhibits no discharge.  Neck: Neck supple.  Cardiovascular: Normal rate, regular rhythm, normal heart sounds and intact distal pulses.  Pulmonary/Chest: Effort normal and breath sounds normal. No stridor. No respiratory distress. He has no wheezes. He has no rales.  Respirations equal and unlabored, patient able to speak in full sentences, lungs clear to auscultation bilaterally  Abdominal: Soft. Bowel sounds are normal. There is tenderness. There is guarding.  Focal tenderness in the right upper quadrant with guarding and positive Murphy sign, minimal tenderness in the right lower quadrant, negative psoas and obturator sign, negative Rovsing's, all other quadrants nontender to palpation, no CVA tenderness  Musculoskeletal: He exhibits no edema or deformity.  Neurological: He is alert. Coordination normal.  Skin: Skin is warm and dry. Capillary refill takes less than 2 seconds. He is not diaphoretic.  Psychiatric: He has a normal mood and affect. His behavior is normal.  Nursing note and vitals reviewed.    ED Treatments / Results  Labs (all labs ordered are listed, but only abnormal results are displayed) Labs Reviewed  COMPREHENSIVE METABOLIC PANEL - Abnormal; Notable for the following components:      Result Value   AST 67 (*)    ALT 68 (*)    All other components within normal limits  CBC - Abnormal; Notable for the following components:   Platelets 120 (*)    All other components within normal limits  URINALYSIS, ROUTINE W REFLEX MICROSCOPIC  LIPASE, BLOOD  HEPATITIS PANEL, ACUTE    EKG  EKG Interpretation None       Radiology Ct Abdomen Pelvis W Contrast  Result Date: 07/29/2017 CLINICAL DATA:  Right lower quadrant pain and nausea x4 days. History of back surgery. EXAM: CT ABDOMEN AND PELVIS WITH CONTRAST TECHNIQUE: Multidetector CT imaging of the abdomen and pelvis was performed using the standard protocol following bolus administration of intravenous contrast.  CONTRAST:  ISOVUE-300 IOPAMIDOL (ISOVUE-300) INJECTION 61% COMPARISON:  06/09/2012 CT FINDINGS: Lower chest: Clear lung bases. Normal size heart. No pericardial effusion. Hepatobiliary: Diffuse hepatic steatosis. Tiny too small to characterize hypodensity in the left hepatic lobe measuring 5 mm, more commonly associated with a cyst or hemangioma. Normal gallbladder without stones. No biliary dilatation. Pancreas: Normal Spleen: Normal Adrenals/Urinary Tract: Normal bilateral adrenal glands and kidneys. No obstructive uropathy or enhancing masses. No hydroureteronephrosis. Physiologic distention of the urinary bladder without focal mural thickening or calculi. Stomach/Bowel: Normal-appearing appendix. No bowel obstruction or acute inflammation. Moderate fecal retention within the colon from cecum through mid descending colon. Stomach and small intestine are unremarkable. Vascular/Lymphatic: No significant vascular findings are present. No enlarged abdominal or pelvic lymph nodes. Reproductive: Redemonstration of rim calcified right seminal vesicle cyst with adjacent non calcified cyst, stable since 2013 consistent with a benign finding. Normal size prostate. Other: Small periumbilical fat containing hernia. No ascites or free air. Musculoskeletal: Degenerative disc disease L3 through S1. No acute nor suspicious osseous abnormalities. IMPRESSION: 1. Normal-appearing appendix. No acute bowel inflammation or  obstruction. Moderate fecal retention within the colon. 2. No obstructive uropathy. 3. Hepatic steatosis. 4. Mid to lower lumbar degenerative disc disease. Electronically Signed   By: Tollie Eth M.D.   On: 07/29/2017 20:32   US Abdomen Limited Ruq  Result Date: 07/29/2017 CLINICAL DATA:  57 year old male with a history of abdominal pain, nausea/vomiting and fever EXAM: ULTRASOUND ABDOMEN LIMITED RIGHT UPPER QUADRANT COMPARISON:  CT 06/09/2012 FINDINGS: Gallbladder: No gallstones or wall thickening  visualized. No sonographic Murphy sign noted by sonographer. Common bile duct: Diameter: 2 mm Liver: Increased echogenicity of liver parenchyma. Portal vein is patent on color Doppler imaging with normal direction of blood flow towards the liver. IMPRESSION: Unremarkable sonographic survey of the gallbladder. Heterogeneous echogenicity of liver parenchyma, suggesting medical liver disease, most commonly steatosis. Electronically Signed   By: Gilmer Mor D.O.   On: 07/29/2017 17:48    Procedures Procedures (including critical care time)  Medications Ordered in ED Medications  ondansetron (ZOFRAN) injection 4 mg (4 mg Intravenous Given 07/29/17 1534)  sodium chloride 0.9 % bolus 1,000 mL (0 mLs Intravenous Stopped 07/29/17 1707)  morphine 4 MG/ML injection 4 mg (4 mg Intravenous Given 07/29/17 1622)  morphine 4 MG/ML injection 4 mg (4 mg Intravenous Given 07/29/17 1750)  HYDROmorphone (DILAUDID) injection 1 mg (1 mg Intravenous Given 07/29/17 1904)  iopamidol (ISOVUE-300) 61 % injection 100 mL (100 mLs Intravenous Contrast Given 07/29/17 2004)     Initial Impression / Assessment and Plan / ED Course  I have reviewed the triage vital signs and the nursing notes.  Pertinent labs & imaging results that were available during my care of the patient were reviewed by me and considered in my medical decision making (see chart for details).  Patient sent from his primary care provider for evaluation of right upper quadrant pain, with some associated nausea and vomiting, chills and diarrhea.  On exam vitals are normal and patient appears uncomfortable, but is nontoxic-appearing.  Patient focally tender in the right upper quadrant with positive Murphy sign, with some mild tenderness radiating down into the right lower quadrant.  Will get abdominal labs and start the right upper quadrant ultrasound, IV fluids, Zofran and morphine for pain.  Labs overall reassuring no leukocytosis and normal hemoglobin,, no  electrolyte derangements requiring intervention, kidney function is normal, slight elevation in LFTs, AST is 67 and ALT is 68. Lipase normal.  No evidence of infection or signs of kidney stone on urinalysis.   Right upper quadrant ultrasound shows normal gallbladder, it does show some Increased echogenicity of the liver parenchyma suggesting medical liver disease, most commonly steatosis, Given slight elevation in liver enzymes we will send off hepatitis panel.  Discussed these results with the patient, at this time he still uncomfortable and quite tender will proceed with contrasted CT scan of the abdomen and pelvis to rule out other etiologies for this abdominal pain.  CT of the abdomen does not show any acute cause for the patient's pain, once again hepatic steatosis is demonstrated.  There is not appear to be any acute or emergent cause for patient's pain from workup today, at this time is stable for discharge home, will discharge with pain medicine as well as Zofran, patient follow-up closely with his primary care provider.  Strict return precautions discussed.  At this time patient is in agreement with plan expresses understanding no further questions and in no acute distress.  Patient discussed with Dr. Rush Landmark and is in agreement with plan.  Final Clinical  Impressions(s) / ED Diagnoses   Final diagnoses:  RUQ pain  Nausea vomiting and diarrhea    ED Discharge Orders        Ordered    ondansetron (ZOFRAN ODT) 4 MG disintegrating tablet     07/29/17 2104    oxyCODONE-acetaminophen (PERCOCET) 5-325 MG tablet  Every 4 hours PRN,   Status:  Discontinued     07/29/17 2111    Oxycodone HCl 10 MG TABS  Every 4 hours PRN     07/29/17 2126       Dartha Lodge, PA-C 07/30/17 1147    Tegeler, Canary Brim, MD 07/30/17 [phone]

## 2017-07-29 NOTE — ED Notes (Signed)
Patient transported to Ultrasound 

## 2017-07-29 NOTE — Discharge Instructions (Signed)
CT scan and right upper quadrant ultrasound are reassuring, labs do not show any signs of infection, your liver enzymes were just slightly elevated, have sent a hepatitis panel which is pending and will come back in the next few days.  You may use Zofran as needed for nausea, and Percocet for pain, you may take NSAIDs in addition to this but do not combine with any other over-the-counter pain relievers.  Please follow-up with your primary care doctor in the next few days.  If you have significantly worsening pain, persistent nausea or vomiting and you are unable to keep down fluids, fevers or chills or other new or concerning symptoms please return to the ED for reevaluation.

## 2017-07-30 LAB — HEPATITIS PANEL, ACUTE
HCV Ab: <0.1 s/co ratio (ref 0.0–0.9)
HEP A IGM: NEGATIVE
Hep B C IgM: NEGATIVE
Hepatitis B Surface Ag: NEGATIVE

## 2018-06-01 IMAGING — US US ABDOMEN LIMITED
1 series · 14 of 25 positions shown · non-contrast
Comparison: CT 06/09/2012

CLINICAL DATA: 56-year-old male with a history of abdominal pain,
nausea/vomiting and fever

EXAM:
ULTRASOUND ABDOMEN LIMITED RIGHT UPPER QUADRANT

[Series 1: us abdomen limited · 0.17mm/px · 14 of 38 slices shown]
[im 1/38]
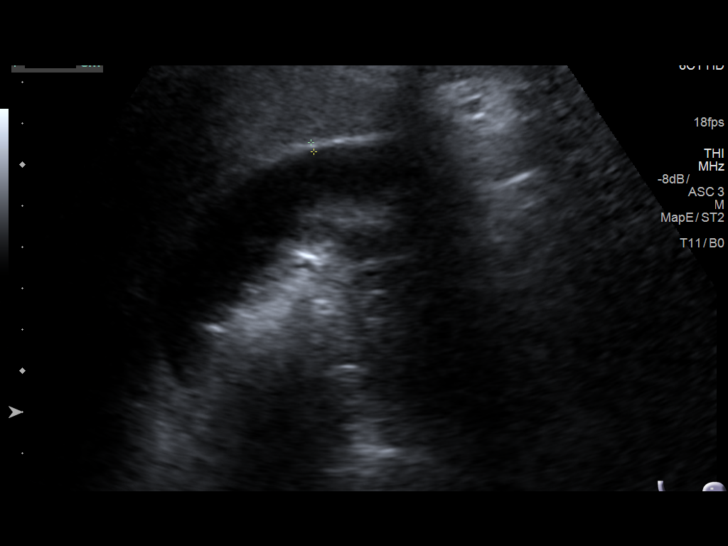
[im 4/38]
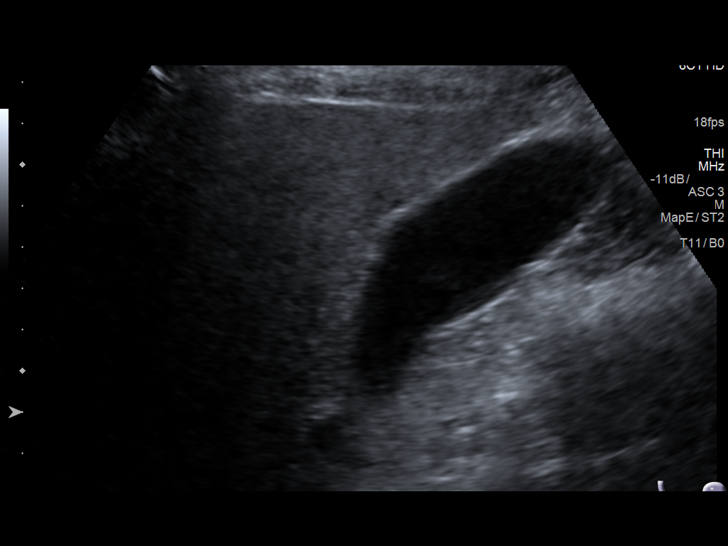
[im 7/38]
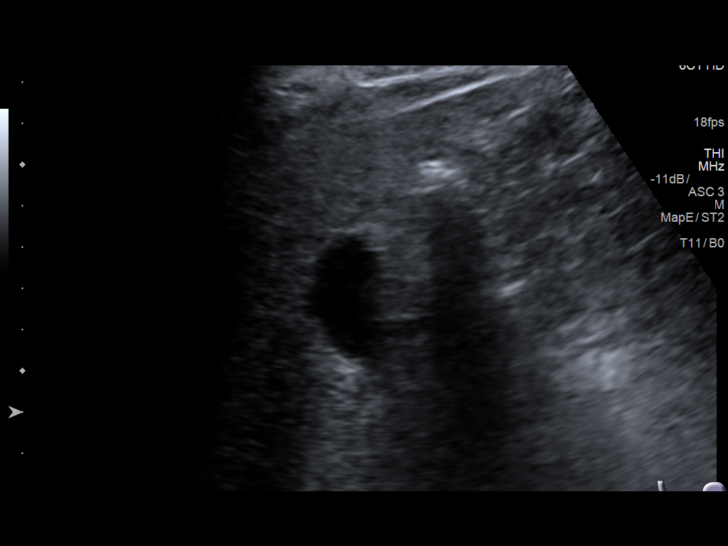
[im 10/38]
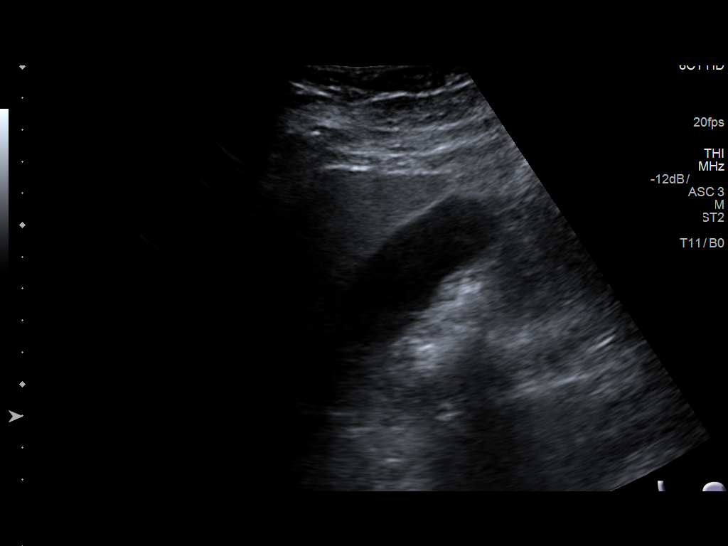
[im 13/38]
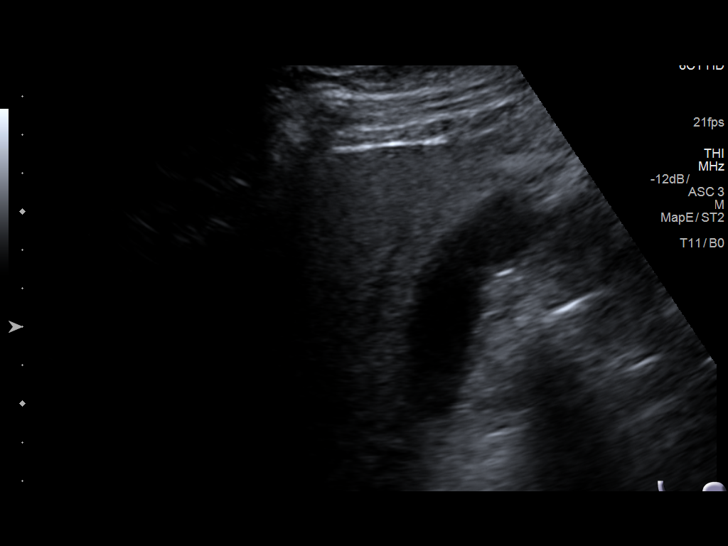
[im 14/38]
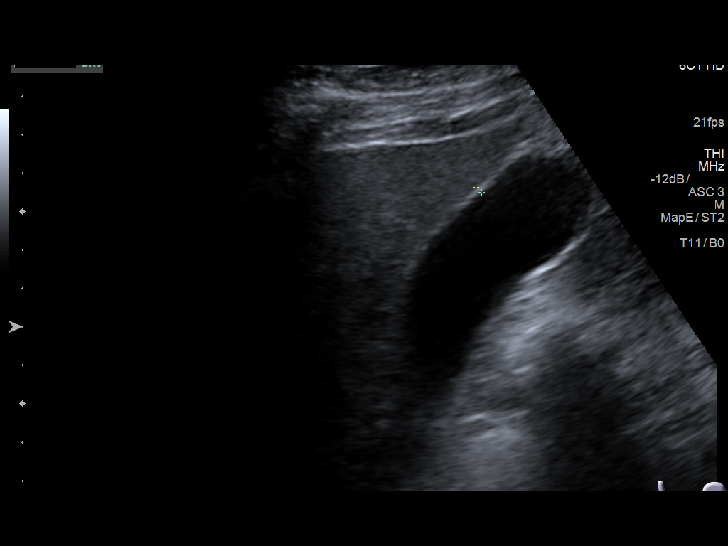
[im 17/38]
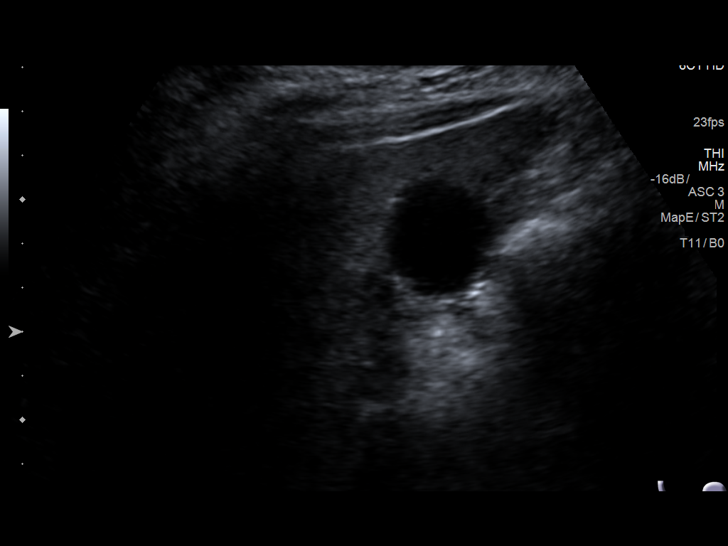
[im 21/38]
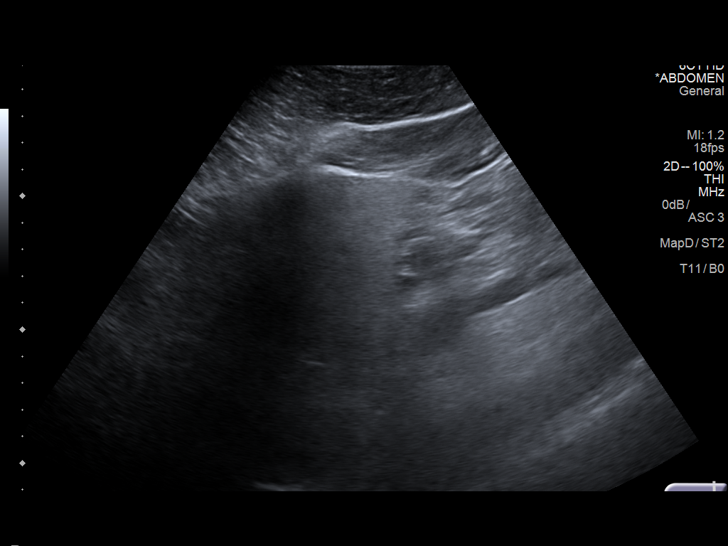
[im 24/38]
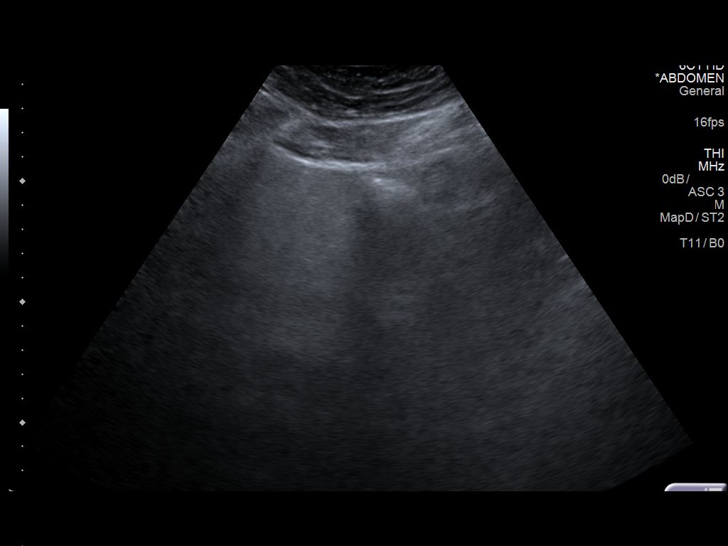
[im 25/38]
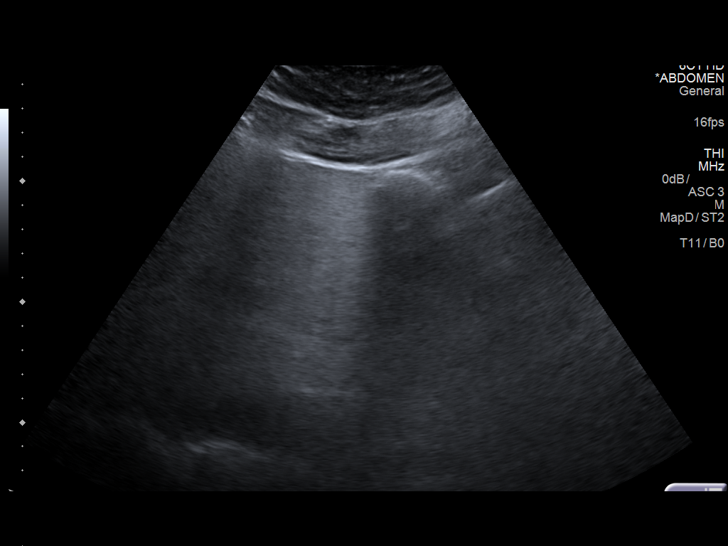
[im 28/38]
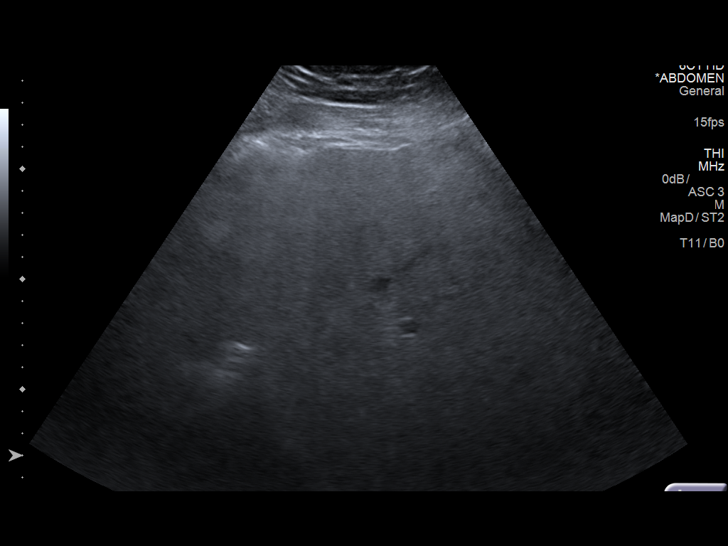
[im 31/38]
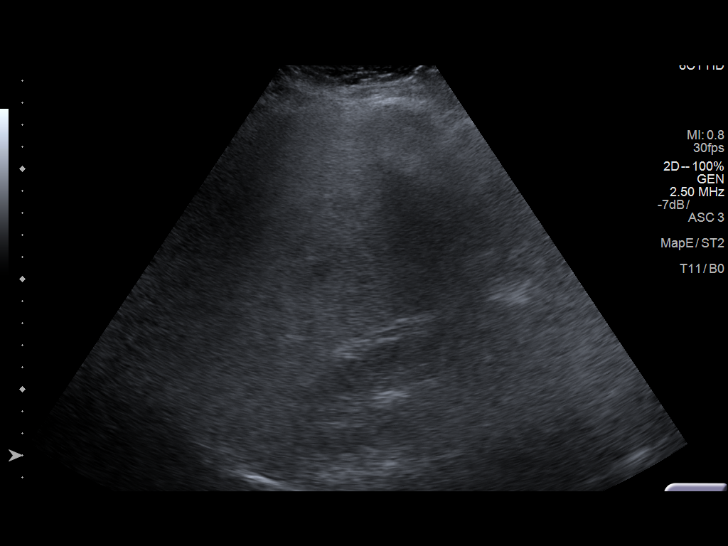
[im 34/38]
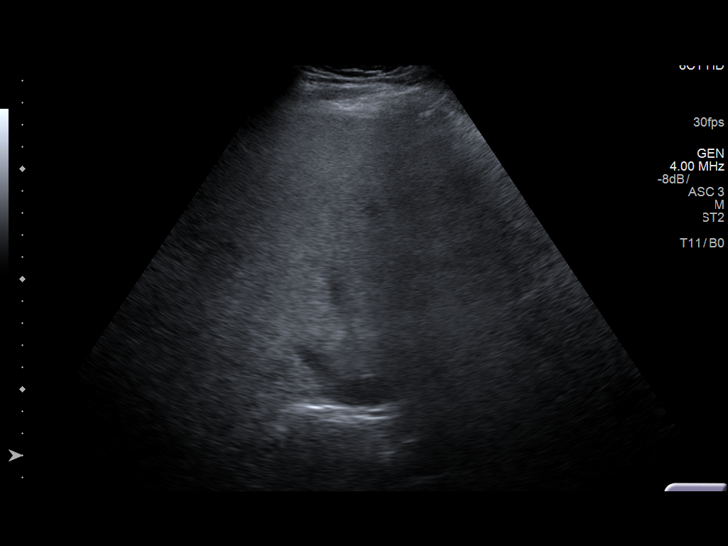
[im 38/38]
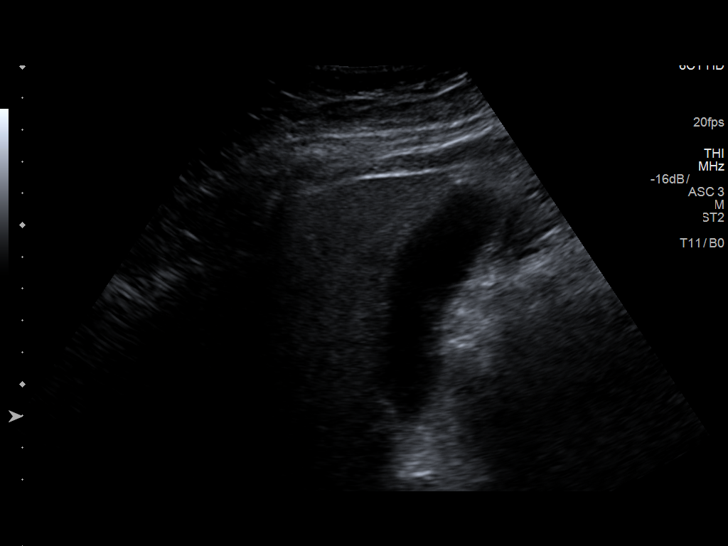

[14 of 25 positions shown; findings below may reference images not displayed]

FINDINGS: Gallbladder:

No gallstones or wall thickening visualized. No sonographic Murphy
sign noted by sonographer.

Common bile duct:

Diameter: 2 mm

Liver:

Increased echogenicity of liver parenchyma. Portal vein is patent on
color Doppler imaging with normal direction of blood flow towards
the liver.
IMPRESSION: Unremarkable sonographic survey of the gallbladder.

Heterogeneous echogenicity of liver parenchyma, suggesting medical
liver disease, most commonly steatosis.

## 2018-08-28 IMAGING — CT CT ABD-PELV W/ CM
2 of 5 series · 16 of 46 positions shown, 18 images · IV contrast (APPLIED)
Comparison: 06/09/2012 CT

CLINICAL DATA: Right lower quadrant pain and nausea x4 days.
History of back surgery.

EXAM:
CT ABDOMEN AND PELVIS WITH CONTRAST
TECHNIQUE: Multidetector CT imaging of the abdomen and pelvis was performed
using the standard protocol following bolus administration of
intravenous contrast.
CONTRAST:  100mL 7BZFVT-VFF IOPAMIDOL (7BZFVT-VFF) INJECTION 61%

[Series 2: axial st · axial · 0.89mm/px · z∈[-535,-105]mm · 13 of 98 slices shown, 15 images]
[im 6/98  soft-tissue]
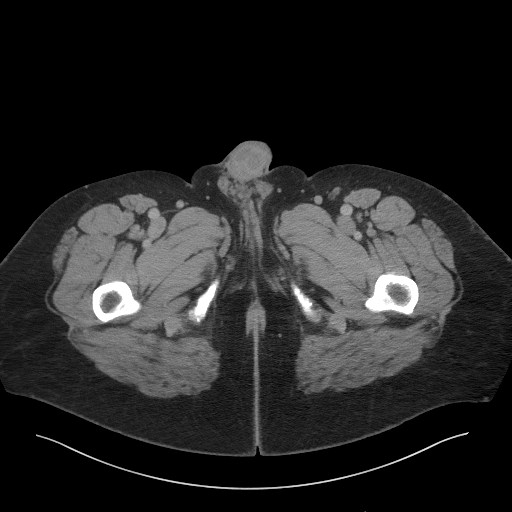
[im 6/98  bone]
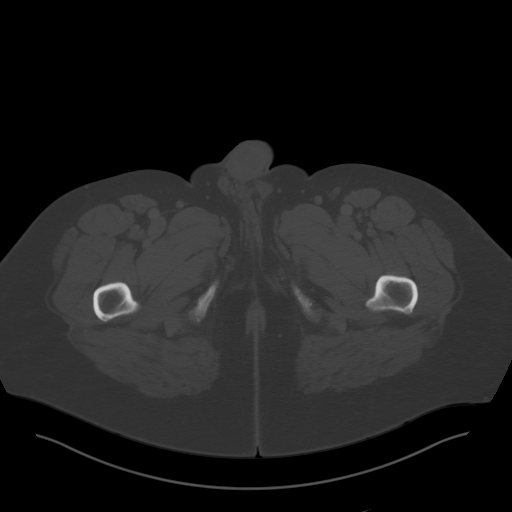
[im 11/98  soft-tissue]
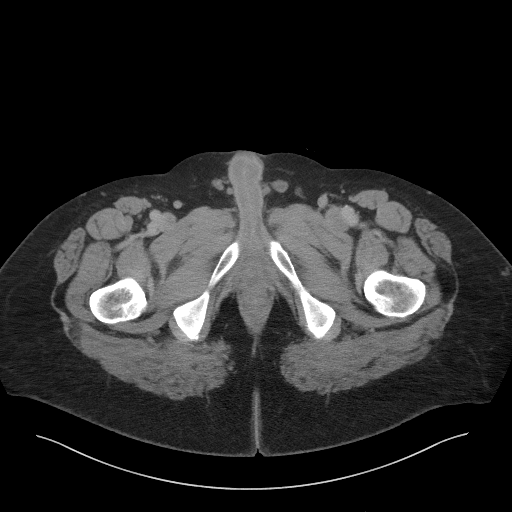
[im 22/98  soft-tissue]
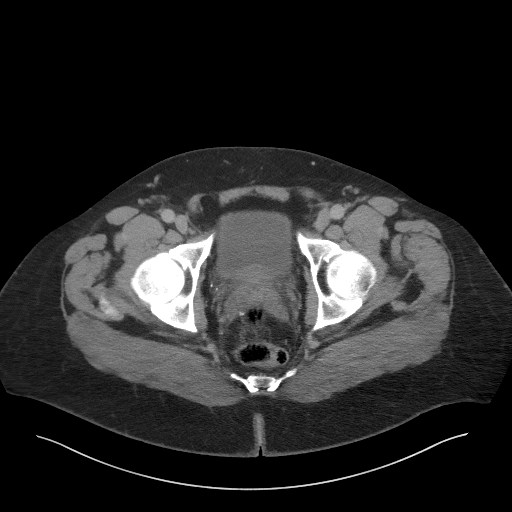
[im 27/98  soft-tissue]
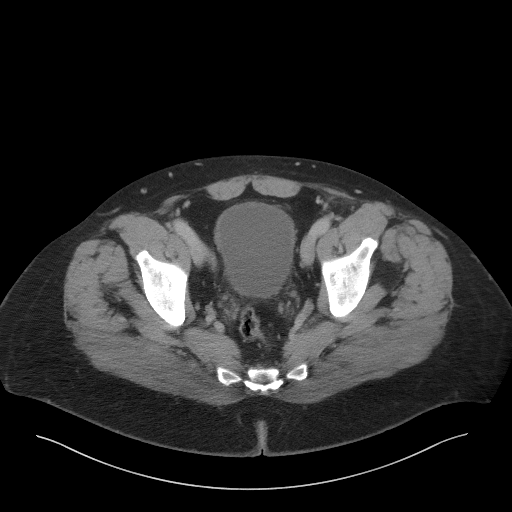
[im 33/98  soft-tissue]
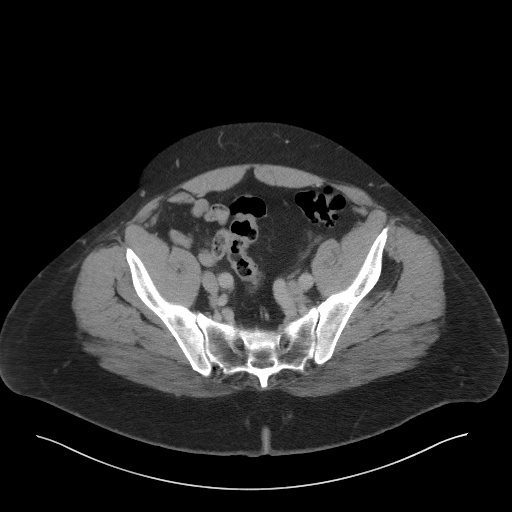
[im 44/98  soft-tissue]
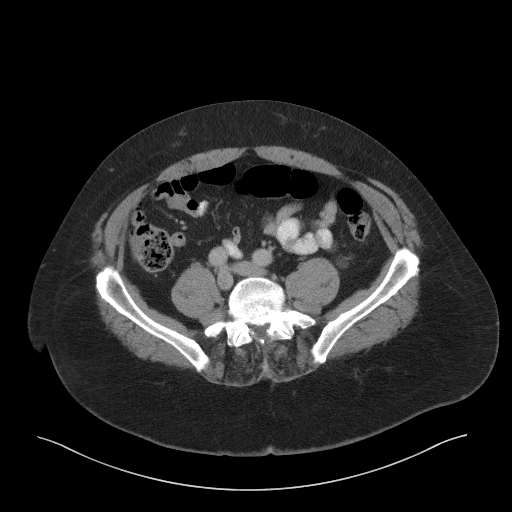
[im 49/98  soft-tissue]
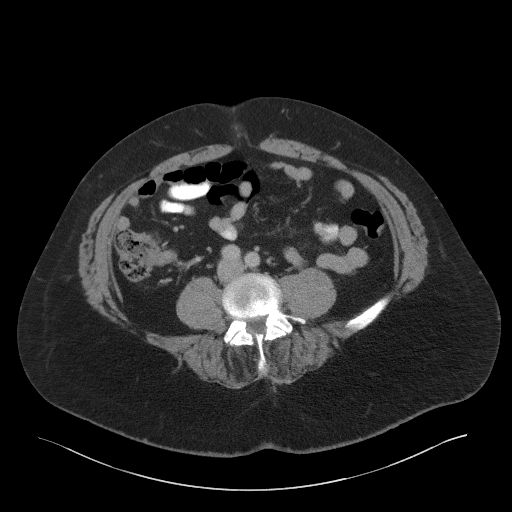
[im 54/98  soft-tissue]
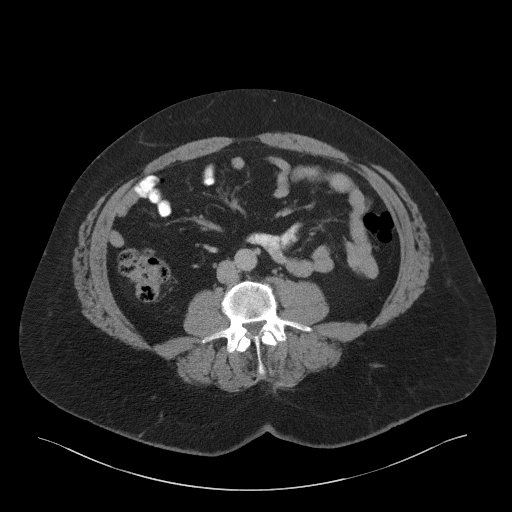
[im 65/98  soft-tissue]
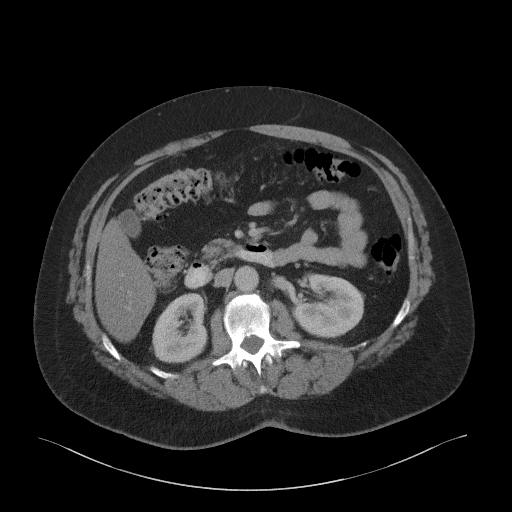
[im 65/98  bone]
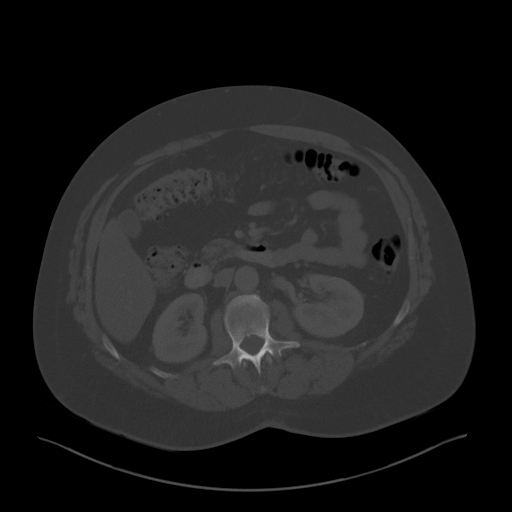
[im 71/98  soft-tissue]
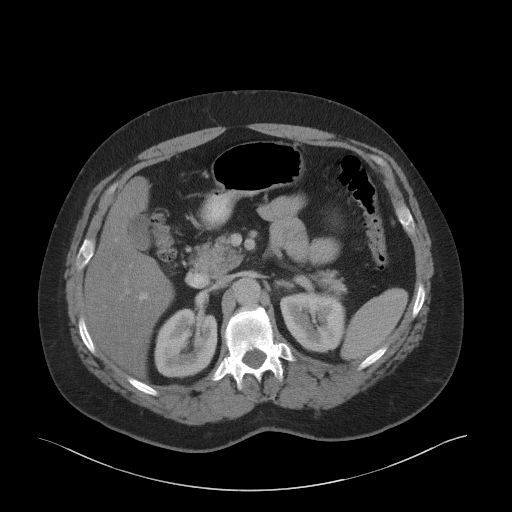
[im 76/98  soft-tissue]
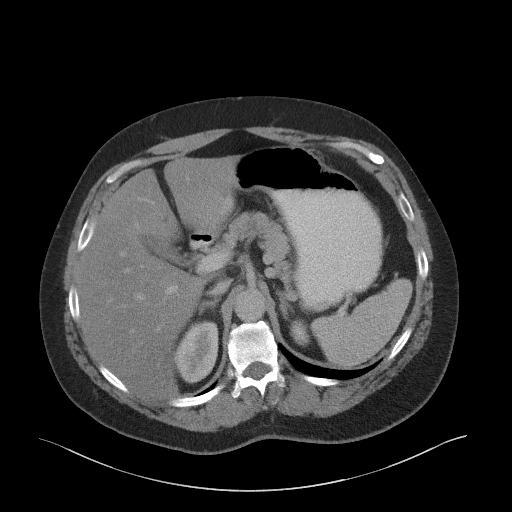
[im 87/98  soft-tissue]
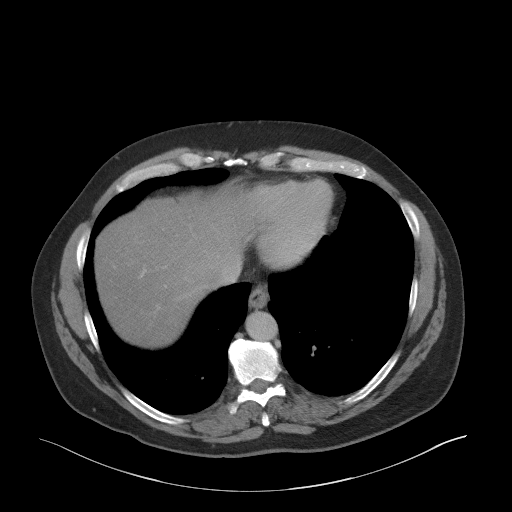
[im 92/98  soft-tissue]
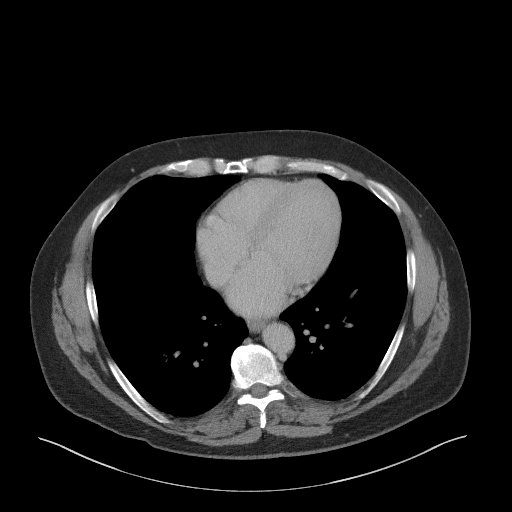

[Series 5: coronal st · coronal · 0.82mm/px · 3 of 107 slices shown]
[im 36/107  soft-tissue]
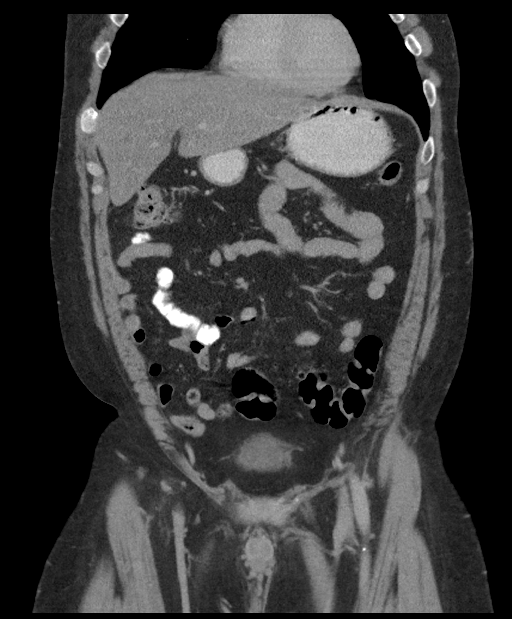
[im 48/107  soft-tissue]
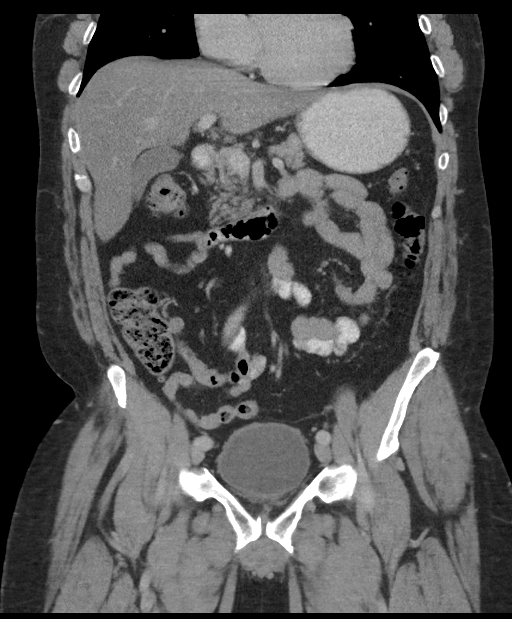
[im 59/107  soft-tissue]
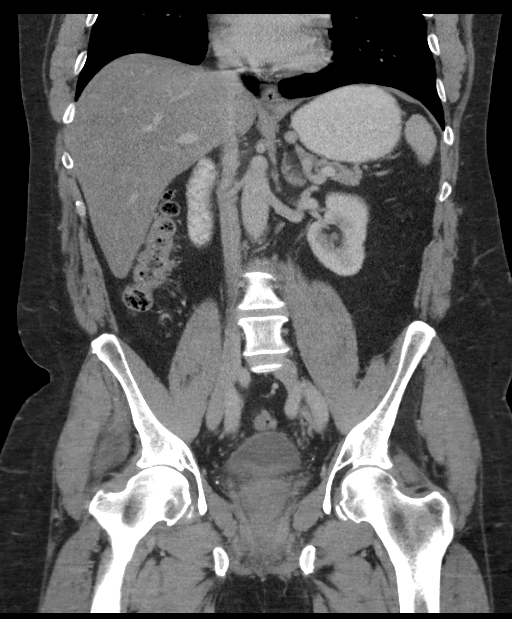

[16 of 46 positions shown; findings below may reference images not displayed]

FINDINGS: Lower chest: Clear lung bases. Normal size heart. No pericardial
effusion.

Hepatobiliary: Diffuse hepatic steatosis. Tiny too small to
characterize hypodensity in the left hepatic lobe measuring 5 mm,
more commonly associated with a cyst or hemangioma. Normal
gallbladder without stones. No biliary dilatation.

Pancreas: Normal

Spleen: Normal

Adrenals/Urinary Tract: Normal bilateral adrenal glands and kidneys.
No obstructive uropathy or enhancing masses. No
hydroureteronephrosis. Physiologic distention of the urinary bladder
without focal mural thickening or calculi.

Stomach/Bowel: Normal-appearing appendix. No bowel obstruction or
acute inflammation. Moderate fecal retention within the colon from
cecum through mid descending colon. Stomach and small intestine are
unremarkable.

Vascular/Lymphatic: No significant vascular findings are present. No
enlarged abdominal or pelvic lymph nodes.

Reproductive: Redemonstration of rim calcified right seminal vesicle
cyst with adjacent non calcified cyst, stable since 7756 consistent
with a benign finding. Normal size prostate.

Other: Small periumbilical fat containing hernia. No ascites or free
air.

Musculoskeletal: Degenerative disc disease L3 through S1. No acute
nor suspicious osseous abnormalities.
IMPRESSION: 1. Normal-appearing appendix. No acute bowel inflammation or
obstruction. Moderate fecal retention within the colon.
2. No obstructive uropathy.
3. Hepatic steatosis.
4. Mid to lower lumbar degenerative disc disease.

## 2019-09-24 ENCOUNTER — Ambulatory Visit: Payer: Self-pay | Attending: Internal Medicine

## 2019-09-24 DIAGNOSIS — Z23 Encounter for immunization: Secondary | ICD-10-CM

## 2019-09-24 NOTE — Progress Notes (Signed)
   Covid-19 Vaccination Clinic  Name:  EMILLIO NGO    MRN: [id number] DOB: May 03, 1961  09/24/2019  Mr. Buendia was observed post Covid-19 immunization for 15 minutes without incident. He was provided with Vaccine Information Sheet and instruction to access the V-Safe system.   Mr. Edgecombe was instructed to call 911 with any severe reactions post vaccine: Marland Kitchen Difficulty breathing  . Swelling of face and throat  . A fast heartbeat  . A bad rash all over body  . Dizziness and weakness   Immunizations Administered    Name Date Dose VIS Date Route   Pfizer COVID-19 Vaccine 09/24/2019  9:01 AM 0.3 mL 05/29/2019 Intramuscular   Manufacturer: [employer], [employer]   Lot: [id number]   NDC: [address]-0

## 2019-10-19 ENCOUNTER — Ambulatory Visit: Payer: Self-pay | Attending: Internal Medicine

## 2019-10-19 DIAGNOSIS — Z23 Encounter for immunization: Secondary | ICD-10-CM

## 2019-10-19 NOTE — Progress Notes (Signed)
   Covid-19 Vaccination Clinic  Name:  Troy Barr    MRN: [id number] DOB: April 14, 1961  10/19/2019  Troy Barr was observed post Covid-19 immunization for 15 minutes without incident. He was provided with Vaccine Information Sheet and instruction to access the V-Safe system.   Troy Barr was instructed to call 911 with any severe reactions post vaccine: Marland Kitchen Difficulty breathing  . Swelling of face and throat  . A fast heartbeat  . A bad rash all over body  . Dizziness and weakness   Immunizations Administered    Name Date Dose VIS Date Route   Pfizer COVID-19 Vaccine 10/19/2019  8:15 AM 0.3 mL 08/12/2018 Intramuscular   Manufacturer: [employer], [employer]   Lot: [id number]   NDC: [address]-6
